# Patient Record
Sex: Female | Born: 1990 | Race: White | Hispanic: No | Smoking: Never smoker
Health system: Southern US, Community
[De-identification: ages and names within clinical notes are randomized; demographics above are authoritative.]

## PROBLEM LIST (undated history)

## (undated) DIAGNOSIS — S92309A Fracture of unspecified metatarsal bone(s), unspecified foot, initial encounter for closed fracture: Secondary | ICD-10-CM

---

## 2017-07-07 HISTORY — PX: TUBAL LIGATION: SHX77

## 2018-02-12 DIAGNOSIS — Z2839 Other underimmunization status: Secondary | ICD-10-CM | POA: Insufficient documentation

## 2018-02-12 DIAGNOSIS — E119 Type 2 diabetes mellitus without complications: Secondary | ICD-10-CM | POA: Insufficient documentation

## 2018-02-15 DIAGNOSIS — O24410 Gestational diabetes mellitus in pregnancy, diet controlled: Secondary | ICD-10-CM | POA: Insufficient documentation

## 2018-02-15 DIAGNOSIS — Z803 Family history of malignant neoplasm of breast: Secondary | ICD-10-CM | POA: Insufficient documentation

## 2018-02-15 DIAGNOSIS — O99013 Anemia complicating pregnancy, third trimester: Secondary | ICD-10-CM | POA: Insufficient documentation

## 2018-02-15 DIAGNOSIS — Z8759 Personal history of other complications of pregnancy, childbirth and the puerperium: Secondary | ICD-10-CM | POA: Insufficient documentation

## 2018-03-02 DIAGNOSIS — Z98891 History of uterine scar from previous surgery: Secondary | ICD-10-CM | POA: Insufficient documentation

## 2020-10-12 DIAGNOSIS — S92211A Displaced fracture of cuboid bone of right foot, initial encounter for closed fracture: Secondary | ICD-10-CM | POA: Diagnosis not present

## 2020-10-12 DIAGNOSIS — S92001B Unspecified fracture of right calcaneus, initial encounter for open fracture: Secondary | ICD-10-CM | POA: Diagnosis not present

## 2020-10-12 DIAGNOSIS — S92309A Fracture of unspecified metatarsal bone(s), unspecified foot, initial encounter for closed fracture: Secondary | ICD-10-CM | POA: Diagnosis not present

## 2020-10-18 ENCOUNTER — Ambulatory Visit: Payer: Medicaid Other | Admitting: Podiatry

## 2020-10-18 ENCOUNTER — Other Ambulatory Visit: Payer: Self-pay

## 2020-10-18 DIAGNOSIS — R600 Localized edema: Secondary | ICD-10-CM

## 2020-10-18 DIAGNOSIS — S92311A Displaced fracture of first metatarsal bone, right foot, initial encounter for closed fracture: Secondary | ICD-10-CM | POA: Diagnosis not present

## 2020-10-18 DIAGNOSIS — S92211A Displaced fracture of cuboid bone of right foot, initial encounter for closed fracture: Secondary | ICD-10-CM | POA: Diagnosis not present

## 2020-10-18 DIAGNOSIS — S92301A Fracture of unspecified metatarsal bone(s), right foot, initial encounter for closed fracture: Secondary | ICD-10-CM | POA: Diagnosis not present

## 2020-10-18 DIAGNOSIS — S92514A Nondisplaced fracture of proximal phalanx of right lesser toe(s), initial encounter for closed fracture: Secondary | ICD-10-CM
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
## 2020-10-24 ENCOUNTER — Telehealth: Payer: Self-pay | Admitting: Urology

## 2020-10-24 NOTE — Telephone Encounter (Signed)
DOS - 10/27/20  TREATMENT OF TARSOMETATARSAL DISLOCATION --- 11657 OPEN TREATMENT METATARSAL --- 90383 - X'S 5 OPEN TX TARSAL FRAC --- 33832  OPEN TX FRACTURE PHALANX --- 91916 - X'S 2  [employer] EFFECTIVE DATE - 01/05/20  PLAN DEDUCTIBLE - $0.00 W/ $0.00 REMAINING OUT OF POCKET -  Member's individual out-of-pocket maximum has no limit COINSURANCE - 0% COPAY - $0.00  RECEIVED FAX FROM [facility] FOR CPT CODES [address], [phone], [phone] AND [phone] HAVE BEEN APPROVED, GOOD FROM 10/27/20 - 01/25/21.

## 2020-10-26 ENCOUNTER — Ambulatory Visit: Payer: Self-pay | Admitting: Podiatry

## 2020-10-26 DIAGNOSIS — S92311A Displaced fracture of first metatarsal bone, right foot, initial encounter for closed fracture: Secondary | ICD-10-CM

## 2020-10-30 ENCOUNTER — Other Ambulatory Visit: Payer: Self-pay

## 2020-10-30 ENCOUNTER — Encounter (HOSPITAL_BASED_OUTPATIENT_CLINIC_OR_DEPARTMENT_OTHER): Payer: Self-pay | Admitting: Podiatry

## 2020-10-30 NOTE — Progress Notes (Addendum)
Spoke w/ via phone for pre-op interview---pt Lab needs dos----  Urine poct             Lab results------none COVID test ------11-01-2020 900 am Arrive at -------530 am 11-02-2020 NPO after MN NO Solid Food.  Clear liquids from MN until---430 am then npo Med rec completed Medications to take morning of surgery -----none Diabetic medication -----n/a Patient instructed to bring photo id and insurance card day of surgery Patient aware to have Driver (ride ) / caregiver  Boyfriend lewis will stay   for 24 hours after surgery  Patient Special Instructions -----none Pre-Op special Istructions -----none Patient verbalized understanding of instructions that were given at this phone interview. Patient denies shortness of breath, chest pain, fever, cough at this phone interview.  H & P dated 10-26-2020 travis phillips pa on chart for 11-02-2020

## 2020-11-01 ENCOUNTER — Encounter: Payer: Medicaid Other | Admitting: Podiatry

## 2020-11-01 ENCOUNTER — Other Ambulatory Visit (HOSPITAL_COMMUNITY)
Admission: RE | Admit: 2020-11-01 | Discharge: 2020-11-01 | Disposition: A | Discharge status: Home / Self Care | Payer: Medicaid Other | Source: Ambulatory Visit | Attending: Podiatry | Admitting: Podiatry

## 2020-11-01 DIAGNOSIS — Z20822 Contact with and (suspected) exposure to covid-19: Secondary | ICD-10-CM | POA: Diagnosis not present

## 2020-11-01 DIAGNOSIS — Z01812 Encounter for preprocedural laboratory examination: Secondary | ICD-10-CM | POA: Diagnosis not present

## 2020-11-01 LAB — SARS CORONAVIRUS 2 (TAT 6-24 HRS): SARS Coronavirus 2: NEGATIVE

## 2020-11-02 ENCOUNTER — Other Ambulatory Visit: Payer: Self-pay

## 2020-11-02 ENCOUNTER — Ambulatory Visit (HOSPITAL_BASED_OUTPATIENT_CLINIC_OR_DEPARTMENT_OTHER)
Admission: RE | Admit: 2020-11-02 | Discharge: 2020-11-02 | Disposition: A | Discharge status: Home / Self Care | Payer: Medicaid Other | Attending: Podiatry | Admitting: Podiatry

## 2020-11-02 ENCOUNTER — Encounter (HOSPITAL_BASED_OUTPATIENT_CLINIC_OR_DEPARTMENT_OTHER)
Admission: RE | Disposition: A | Discharge status: Home / Self Care | Payer: Self-pay | Source: Home / Self Care | Attending: Podiatry

## 2020-11-02 ENCOUNTER — Encounter (HOSPITAL_BASED_OUTPATIENT_CLINIC_OR_DEPARTMENT_OTHER): Payer: Self-pay | Admitting: Podiatry

## 2020-11-02 ENCOUNTER — Ambulatory Visit (HOSPITAL_BASED_OUTPATIENT_CLINIC_OR_DEPARTMENT_OTHER): Payer: Medicaid Other | Admitting: Anesthesiology

## 2020-11-02 DIAGNOSIS — S92311A Displaced fracture of first metatarsal bone, right foot, initial encounter for closed fracture: Secondary | ICD-10-CM | POA: Diagnosis not present

## 2020-11-02 DIAGNOSIS — S92511A Displaced fracture of proximal phalanx of right lesser toe(s), initial encounter for closed fracture: Secondary | ICD-10-CM | POA: Diagnosis not present

## 2020-11-02 DIAGNOSIS — S92211A Displaced fracture of cuboid bone of right foot, initial encounter for closed fracture: Secondary | ICD-10-CM | POA: Insufficient documentation

## 2020-11-02 DIAGNOSIS — Z5309 Procedure and treatment not carried out because of other contraindication: Secondary | ICD-10-CM | POA: Diagnosis not present

## 2020-11-02 DIAGNOSIS — S99101A Unspecified physeal fracture of right metatarsal, initial encounter for closed fracture: Secondary | ICD-10-CM | POA: Insufficient documentation

## 2020-11-02 HISTORY — DX: Fracture of unspecified metatarsal bone(s), unspecified foot, initial encounter for closed fracture: S92.309A

## 2020-11-02 LAB — POCT PREGNANCY, URINE: Preg Test, Ur: NEGATIVE

## 2020-11-02 SURGERY — CANCELLED PROCEDURE
Anesthesia: Regional | Site: Toe | Laterality: Right

## 2020-11-02 MED ORDER — SUCCINYLCHOLINE CHLORIDE 200 MG/10ML IV SOSY
PREFILLED_SYRINGE | INTRAVENOUS | Status: AC
  Filled 2020-11-02: qty 10

## 2020-11-02 MED ORDER — ONDANSETRON HCL 4 MG/2ML IJ SOLN
INTRAMUSCULAR | Status: AC
  Filled 2020-11-02: qty 2

## 2020-11-02 MED ORDER — DEXAMETHASONE SODIUM PHOSPHATE 10 MG/ML IJ SOLN
INTRAMUSCULAR | Status: AC
  Filled 2020-11-02: qty 1

## 2020-11-02 MED ORDER — LIDOCAINE 2% (20 MG/ML) 5 ML SYRINGE
INTRAMUSCULAR | Status: AC
  Filled 2020-11-02: qty 5

## 2020-11-02 MED ORDER — LACTATED RINGERS IV SOLN: INTRAVENOUS | Status: DC

## 2020-11-02 MED ORDER — ROCURONIUM BROMIDE 10 MG/ML (PF) SYRINGE
PREFILLED_SYRINGE | INTRAVENOUS | Status: AC
  Filled 2020-11-02: qty 10

## 2020-11-02 MED ORDER — CEFAZOLIN SODIUM-DEXTROSE 2-4 GM/100ML-% IV SOLN
INTRAVENOUS | Status: AC
  Filled 2020-11-02: qty 100

## 2020-11-02 MED ORDER — PHENYLEPHRINE 40 MCG/ML (10ML) SYRINGE FOR IV PUSH (FOR BLOOD PRESSURE SUPPORT)
PREFILLED_SYRINGE | INTRAVENOUS | Status: AC
  Filled 2020-11-02: qty 10

## 2020-11-02 MED ORDER — FENTANYL CITRATE (PF) 100 MCG/2ML IJ SOLN
INTRAMUSCULAR | Status: AC
  Filled 2020-11-02: qty 2

## 2020-11-02 MED ORDER — FENTANYL CITRATE (PF) 100 MCG/2ML IJ SOLN
100.0000 ug | Freq: Once | INTRAMUSCULAR | Status: AC
  Administered 2020-11-02: 100 ug via INTRAVENOUS

## 2020-11-02 MED ORDER — BUPIVACAINE-EPINEPHRINE (PF) 0.5% -1:200000 IJ SOLN
INTRAMUSCULAR | Status: AC | PRN
  Administered 2020-11-02: 30 mL

## 2020-11-02 MED ORDER — KETOROLAC TROMETHAMINE 30 MG/ML IJ SOLN
INTRAMUSCULAR | Status: AC
  Filled 2020-11-02: qty 1

## 2020-11-02 MED ORDER — EPHEDRINE 5 MG/ML INJ
INTRAVENOUS | Status: AC
  Filled 2020-11-02: qty 10

## 2020-11-02 MED ORDER — GLYCOPYRROLATE PF 0.2 MG/ML IJ SOSY
PREFILLED_SYRINGE | INTRAMUSCULAR | Status: AC
  Filled 2020-11-02: qty 1

## 2020-11-02 MED ORDER — FENTANYL CITRATE (PF) 100 MCG/2ML IJ SOLN
INTRAMUSCULAR | Status: AC
  Filled 2020-11-02: qty 4

## 2020-11-02 MED ORDER — ATROPINE SULFATE 0.4 MG/ML IJ SOLN
INTRAMUSCULAR | Status: AC
  Filled 2020-11-02: qty 1

## 2020-11-02 MED ORDER — CEFAZOLIN SODIUM-DEXTROSE 2-4 GM/100ML-% IV SOLN: 2.0000 g | INTRAVENOUS | Status: DC

## 2020-11-02 MED ORDER — 0.9 % SODIUM CHLORIDE (POUR BTL) OPTIME
TOPICAL | Status: DC | PRN
  Administered 2020-11-02: 1000 mL

## 2020-11-02 MED ORDER — MIDAZOLAM HCL 2 MG/2ML IJ SOLN
INTRAMUSCULAR | Status: AC
  Filled 2020-11-02: qty 2

## 2020-11-02 MED ORDER — MIDAZOLAM HCL 2 MG/2ML IJ SOLN
2.0000 mg | Freq: Once | INTRAMUSCULAR | Status: AC
  Administered 2020-11-02: 2 mg via INTRAVENOUS

## 2020-11-02 MED ORDER — ROPIVACAINE HCL 7.5 MG/ML IJ SOLN
INTRAMUSCULAR | Status: AC | PRN
  Administered 2020-11-02: 15 mL via PERINEURAL

## 2020-11-02 MED ORDER — PROPOFOL 10 MG/ML IV BOLUS
INTRAVENOUS | Status: AC
  Filled 2020-11-02: qty 40

## 2020-11-02 SURGICAL SUPPLY — 50 items
BANDAGE ESMARK 6X9 LF (GAUZE/BANDAGES/DRESSINGS) IMPLANT
BLADE AVERAGE 25X9 (BLADE) IMPLANT
BLADE MINI RND TIP GREEN BEAV (BLADE) IMPLANT
BLADE OSC/SAG .038X5.5 CUT EDG (BLADE) IMPLANT
BLADE SURG 15 STRL LF DISP TIS (BLADE) IMPLANT
BLADE SURG 15 STRL SS (BLADE)
BNDG ELASTIC 4X5.8 VLCR STR LF (GAUZE/BANDAGES/DRESSINGS) IMPLANT
BNDG ESMARK 6X9 LF (GAUZE/BANDAGES/DRESSINGS)
BNDG GAUZE ELAST 4 BULKY (GAUZE/BANDAGES/DRESSINGS) IMPLANT
CHLORAPREP W/TINT 26 (MISCELLANEOUS) IMPLANT
COVER BACK TABLE 60X90IN (DRAPES) IMPLANT
COVER WAND RF STERILE (DRAPES) IMPLANT
CUFF TOURN SGL QUICK 18X4 (TOURNIQUET CUFF) IMPLANT
CUFF TOURN SGL QUICK 24 (TOURNIQUET CUFF)
CUFF TOURN SGL QUICK 34 (TOURNIQUET CUFF)
CUFF TRNQT CYL 24X4X16.5-23 (TOURNIQUET CUFF) IMPLANT
CUFF TRNQT CYL 34X4.125X (TOURNIQUET CUFF) IMPLANT
DECANTER SPIKE VIAL GLASS SM (MISCELLANEOUS) IMPLANT
DRAPE 3/4 80X56 (DRAPES) IMPLANT
DRAPE C-ARM 35X43 STRL (DRAPES) IMPLANT
DRAPE EXTREMITY T 121X128X90 (DISPOSABLE) IMPLANT
DRAPE U-SHAPE 47X51 STRL (DRAPES) IMPLANT
ELECT REM PT RETURN 9FT ADLT (ELECTROSURGICAL)
ELECTRODE REM PT RTRN 9FT ADLT (ELECTROSURGICAL) IMPLANT
GAUZE SPONGE 4X4 12PLY STRL (GAUZE/BANDAGES/DRESSINGS) IMPLANT
GAUZE XEROFORM 1X8 LF (GAUZE/BANDAGES/DRESSINGS) IMPLANT
GLOVE SRG 8 PF TXTR STRL LF DI (GLOVE) IMPLANT
GLOVE SURG ENC MOIS LTX SZ7.5 (GLOVE) IMPLANT
GLOVE SURG UNDER POLY LF SZ8 (GLOVE)
GOWN STRL REUS W/TWL XL LVL3 (GOWN DISPOSABLE) IMPLANT
KIT TURNOVER CYSTO (KITS) IMPLANT
NEEDLE HYPO 25X1 1.5 SAFETY (NEEDLE) IMPLANT
NS IRRIG 1000ML POUR BTL (IV SOLUTION) IMPLANT
PACK BASIN DAY SURGERY FS (CUSTOM PROCEDURE TRAY) IMPLANT
PENCIL SMOKE EVACUATOR (MISCELLANEOUS) IMPLANT
STAPLER VISISTAT 35W (STAPLE) IMPLANT
STOCKINETTE 4 (MISCELLANEOUS) IMPLANT
SUCTION FRAZIER HANDLE 10FR (MISCELLANEOUS)
SUCTION TUBE FRAZIER 10FR DISP (MISCELLANEOUS) IMPLANT
SUT ETHILON 4 0 PS 2 18 (SUTURE) IMPLANT
SUT MNCRL AB 3-0 PS2 18 (SUTURE) IMPLANT
SUT MNCRL AB 4-0 PS2 18 (SUTURE) IMPLANT
SUT VIC AB 2-0 SH 27 (SUTURE)
SUT VIC AB 2-0 SH 27XBRD (SUTURE) IMPLANT
SYR BULB EAR ULCER 3OZ GRN STR (SYRINGE) IMPLANT
SYR CONTROL 10ML LL (SYRINGE) IMPLANT
TRAY DSU PREP LF (CUSTOM PROCEDURE TRAY) IMPLANT
TUBE CONNECTING 12X1/4 (SUCTIONS) IMPLANT
UNDERPAD 30X36 HEAVY ABSORB (UNDERPADS AND DIAPERS) IMPLANT
YANKAUER SUCT BULB TIP NO VENT (SUCTIONS) IMPLANT

## 2020-11-02 NOTE — Progress Notes (Signed)
Orthopedic Tech Progress Note Patient Details:  Nancy Kelley May 26, 1991 [id number] Patient's nurse provided patient with crutches  Ortho Devices Type of Ortho Device: CAM walker,Crutches Ortho Device/Splint Location: Right Foot Ortho Device/Splint Interventions: Application   Post Interventions Patient Tolerated: Well   Nancy Kelley 11/02/2020, 9:29 AM

## 2020-11-02 NOTE — Interval H&P Note (Signed)
History and Physical Interval Note:  11/02/2020 7:12 AM  Nancy Kelley  has presented today for surgery, with the diagnosis of metatarsal fractures of right foot.  The various methods of treatment have been discussed with the patient and family. After consideration of risks, benefits and other options for treatment, the patient has consented to  Procedure(s) with comments: Open reduction internal fixation of right first metatarsal fracture, second third fourth fifth as indicated, cuboid if indicated, pinning of digits/metatarsals 2,3,4,5 as indicated (Right) - General with Popliteal/Saphenous block as a surgical intervention.  The patient's history has been reviewed, patient examined, no change in status, stable for surgery.  I have reviewed the patient's chart and labs.  Questions were answered to the patient's satisfaction.     Park Liter

## 2020-11-02 NOTE — Progress Notes (Signed)
Pt surgery canceled.  Pt aware that office will call her to make another date for surgery.  Dressing applied to the right foot and wrapped prior to boot being placed.  Pt given crutches and discharge instructions for post anesthesia & block care.  Instructions given to boyfriend.  Pt discharged in NAD. Vital signs stable.

## 2020-11-02 NOTE — Progress Notes (Signed)
Assisted Dr. Hollis with right, ultrasound guided, popliteal/saphenous block. Side rails up, monitors on throughout procedure. See vital signs in flow sheet. Tolerated Procedure well. 

## 2020-11-02 NOTE — Discharge Instructions (Signed)
Regional Anesthesia Blocks  1. Numbness or the inability to move the "blocked" extremity may last from 3-48 hours after placement. The length of time depends on the medication injected and your individual response to the medication. If the numbness is not going away after 48 hours, call your surgeon.  2. The extremity that is blocked will need to be protected until the numbness is gone and the  Strength has returned. Because you cannot feel it, you will need to take extra care to avoid injury. Because it may be weak, you may have difficulty moving it or using it. You may not know what position it is in without looking at it while the block is in effect.  3. For blocks in the legs and feet, returning to weight bearing and walking needs to be done carefully. You will need to wait until the numbness is entirely gone and the strength has returned. You should be able to move your leg and foot normally before you try and bear weight or walk. You will need someone to be with you when you first try to ensure you do not fall and possibly risk injury.  4. Bruising and tenderness at the needle site are common side effects and will resolve in a few days.  5. Persistent numbness or new problems with movement should be communicated to the surgeon or the [facility] [phone] [facility] [phone]).General Anesthesia, Adult, Care After This sheet gives you information about how to care for yourself after your procedure. Your health care provider may also give you more specific instructions. If you have problems or questions, contact your health care provider. What can I expect after the procedure? After the procedure, the following side effects are common:  Pain or discomfort at the IV site.  Nausea.  Vomiting.  Sore throat.  Trouble concentrating.  Feeling cold or chills.  Feeling weak or tired.  Sleepiness and fatigue.  Soreness and body aches. These side effects  can affect parts of the body that were not involved in surgery. Follow these instructions at home: For the time period you were told by your health care provider:  Rest.  Do not participate in activities where you could fall or become injured.  Do not drive or use machinery.  Do not drink alcohol.  Do not take sleeping pills or medicines that cause drowsiness.  Do not make important decisions or sign legal documents.  Do not take care of children on your own.   Eating and drinking  Follow any instructions from your health care provider about eating or drinking restrictions.  When you feel hungry, start by eating small amounts of foods that are soft and easy to digest (bland), such as toast. Gradually return to your regular diet.  Drink enough fluid to keep your urine pale yellow.  If you vomit, rehydrate by drinking water, juice, or clear broth. General instructions  If you have sleep apnea, surgery and certain medicines can increase your risk for breathing problems. Follow instructions from your health care provider about wearing your sleep device: ? Anytime you are sleeping, including during daytime naps. ? While taking prescription pain medicines, sleeping medicines, or medicines that make you drowsy.  Have a responsible adult stay with you for the time you are told. It is important to have someone help care for you until you are awake and alert.  Return to your normal activities as told by your health care provider. Ask your health care  provider what activities are safe for you.  Take over-the-counter and prescription medicines only as told by your health care provider.  If you smoke, do not smoke without supervision.  Keep all follow-up visits as told by your health care provider. This is important. Contact a health care provider if:  You have nausea or vomiting that does not get better with medicine.  You cannot eat or drink without vomiting.  You have pain that  does not get better with medicine.  You are unable to pass urine.  You develop a skin rash.  You have a fever.  You have redness around your IV site that gets worse. Get help right away if:  You have difficulty breathing.  You have chest pain.  You have blood in your urine or stool, or you vomit blood. Summary  After the procedure, it is common to have a sore throat or nausea. It is also common to feel tired.  Have a responsible adult stay with you for the time you are told. It is important to have someone help care for you until you are awake and alert.  When you feel hungry, start by eating small amounts of foods that are soft and easy to digest (bland), such as toast. Gradually return to your regular diet.  Drink enough fluid to keep your urine pale yellow.  Return to your normal activities as told by your health care provider. Ask your health care provider what activities are safe for you. This information is not intended to replace advice given to you by your health care provider. Make sure you discuss any questions you have with your health care provider. Document Revised: 03/08/2020 Document Reviewed: 10/06/2019 Elsevier Patient Education  2021 Elsevier Inc[employer]   After Surgery Instructions   1) If you are recuperating from surgery anywhere other than home, please be sure to leave [place] the number where you can be reached.  2) Go directly home and rest.  3) Keep the operated foot(feet) elevated six inches above the hip when sitting or lying down. This will help control swelling and pain.  4) Support the elevated foot and leg with pillows. DO NOT PLACE PILLOWS UNDER THE KNEE.  5) DO NOT REMOVE or get your bandages WET, unless you were given different instructions by your doctor to do so. This increases the risk of infection.  6) Wear your surgical shoe or surgical boot at all times when you are up on your feet.  7) A limited amount of pain and swelling may occur. The  skin may take on a bruised appearance. DO NOT BE ALARMED, THIS IS NORMAL.  8) For slight pain and swelling, apply an ice pack directly over the bandages for 15 minutes only out of each hour of the day. Continue until seen in the office for your first post op visit. DO NOT APPLY ANY FORM OF HEAT TO THE AREA.  9) Have prescriptions filled immediately and take as directed.  10) Drink lots of liquids, water and juice to stay hydrated.  11) CALL IMMEDIATELY IF:  *Bleeding continues until the following day of surgery  *Pain increases and/or does not respond to medication  *Bandages or cast appears to tight  *If your bandage gets wet  *Trip, fall or stump your surgical foot  *If your temperature goes above 101  *If you have ANY questions at all  12) You are expected to be non-weightbearing after your surgery.   If you need to reach the nurse  for any reason, please call: [place]/: [phone] [place]: [phone] : [phone]

## 2020-11-02 NOTE — Progress Notes (Signed)
Required hardware not available for the case today.   I discussed with the patient for her safety it would be best to reschedule the case. Patient verbalized understanding.  Park Liter, DPM

## 2020-11-02 NOTE — Anesthesia Preprocedure Evaluation (Addendum)
Anesthesia Evaluation  Patient identified by MRN, date of birth, ID band Patient awake    Reviewed: Allergy & Precautions, NPO status , Patient's Chart, lab work & pertinent test results  Airway Mallampati: II  TM Distance: >3 FB Neck ROM: Full    Dental  (+) Teeth Intact, Dental Advisory Given   Pulmonary neg pulmonary ROS,    breath sounds clear to auscultation       Cardiovascular negative cardio ROS   Rhythm:Regular Rate:Normal     Neuro/Psych negative neurological ROS  negative psych ROS   GI/Hepatic negative GI ROS, Neg liver ROS,   Endo/Other  negative endocrine ROS  Renal/GU negative Renal ROS     Musculoskeletal negative musculoskeletal ROS (+)   Abdominal (+) + obese,   Peds  Hematology negative hematology ROS (+)   Anesthesia Other Findings   Reproductive/Obstetrics                             Anesthesia Physical Anesthesia Plan  ASA: II  Anesthesia Plan: Regional   Post-op Pain Management:    Induction: Intravenous  PONV Risk Score and Plan: 3 and Ondansetron, Propofol infusion and Midazolam  Airway Management Planned: Natural Airway and Simple Face Mask  Additional Equipment: None  Intra-op Plan:   Post-operative Plan: Extubation in OR  Informed Consent: I have reviewed the patients History and Physical, chart, labs and discussed the procedure including the risks, benefits and alternatives for the proposed anesthesia with the patient or authorized representative who has indicated his/her understanding and acceptance.       Plan Discussed with: CRNA  Anesthesia Plan Comments: (Case cancelled due to inability to locate proper instruments and/or fixation devices. Nancy Kelley has already received a peripheral nerve block. She has been education on duration and risk of the block. She was instructed to protect/guard her foot for the next 24-36 hours due to the  nerve block and lack of sensation. Nancy Kelley expresses understanding. )       Anesthesia Quick Evaluation

## 2020-11-05 NOTE — Progress Notes (Addendum)
Spoke w/ via phone for pre-op interview---pt Lab needs dos----  Urine poct  (per anesthesia), surgery  orders pending           Lab results------none COVID test ------rapid covid dos arrive 900 am 11-07-2020 Arrive at ------- 900 am 11-07-2020 NPO after MN NO Solid Food.  Clear liquids from MN until---1000 am am then npo Med rec completed Medications to take morning of surgery -----none Diabetic medication -----n/a Patient instructed to bring photo id and insurance card day of surgery Patient aware to have Driver (ride ) / caregiver  Boyfriend lewis will stay   for 24 hours after surgery  Patient Special Instructions -----none Pre-Op special Istructions -----surgery orders requested dr price epic ib Patient verbalized understanding of instructions that were given at this phone interview. Patient denies shortness of breath, chest pain, fever, cough at this phone interview.  H & P dated 10-26-2020 travis phillips pa on chart for 11-07-2020 surgery  Pt will be rapid covid dos arrive 900 am beverly taavon aware Pt will be in black chevy truck and will call desk

## 2020-11-07 ENCOUNTER — Ambulatory Visit (HOSPITAL_BASED_OUTPATIENT_CLINIC_OR_DEPARTMENT_OTHER): Payer: Medicaid Other | Admitting: Anesthesiology

## 2020-11-07 ENCOUNTER — Encounter (HOSPITAL_BASED_OUTPATIENT_CLINIC_OR_DEPARTMENT_OTHER)
Admission: RE | Disposition: A | Discharge status: Home / Self Care | Payer: Self-pay | Source: Home / Self Care | Attending: Podiatry

## 2020-11-07 ENCOUNTER — Encounter (HOSPITAL_BASED_OUTPATIENT_CLINIC_OR_DEPARTMENT_OTHER): Payer: Self-pay | Admitting: Podiatry

## 2020-11-07 ENCOUNTER — Encounter: Payer: Self-pay | Admitting: Podiatry

## 2020-11-07 ENCOUNTER — Ambulatory Visit (HOSPITAL_COMMUNITY): Payer: Medicaid Other

## 2020-11-07 ENCOUNTER — Ambulatory Visit (HOSPITAL_BASED_OUTPATIENT_CLINIC_OR_DEPARTMENT_OTHER)
Admission: RE | Admit: 2020-11-07 | Discharge: 2020-11-07 | Disposition: A | Discharge status: Home / Self Care | Payer: Medicaid Other | Attending: Podiatry | Admitting: Podiatry

## 2020-11-07 DIAGNOSIS — Z20822 Contact with and (suspected) exposure to covid-19: Secondary | ICD-10-CM | POA: Diagnosis not present

## 2020-11-07 DIAGNOSIS — Z9889 Other specified postprocedural states: Secondary | ICD-10-CM

## 2020-11-07 DIAGNOSIS — S92311A Displaced fracture of first metatarsal bone, right foot, initial encounter for closed fracture: Secondary | ICD-10-CM | POA: Insufficient documentation

## 2020-11-07 DIAGNOSIS — S92301D Fracture of unspecified metatarsal bone(s), right foot, subsequent encounter for fracture with routine healing: Secondary | ICD-10-CM | POA: Diagnosis not present

## 2020-11-07 HISTORY — PX: ORIF TOE FRACTURE: SHX5032

## 2020-11-07 LAB — RESP PANEL BY RT-PCR (FLU A&B, COVID) ARPGX2
Influenza A by PCR: NEGATIVE
Influenza B by PCR: NEGATIVE
SARS Coronavirus 2 by RT PCR: NEGATIVE

## 2020-11-07 LAB — POCT PREGNANCY, URINE: Preg Test, Ur: NEGATIVE

## 2020-11-07 SURGERY — OPEN REDUCTION INTERNAL FIXATION (ORIF) METATARSAL (TOE) FRACTURE
Anesthesia: Regional | Site: Toe | Laterality: Right

## 2020-11-07 MED ORDER — PROPOFOL 10 MG/ML IV BOLUS
INTRAVENOUS | Status: AC
  Filled 2020-11-07: qty 20

## 2020-11-07 MED ORDER — MIDAZOLAM HCL 2 MG/2ML IJ SOLN
2.0000 mg | Freq: Once | INTRAMUSCULAR | Status: AC
  Administered 2020-11-07: 2 mg via INTRAVENOUS

## 2020-11-07 MED ORDER — CEPHALEXIN 500 MG PO CAPS: 500.0000 mg | ORAL_CAPSULE | Freq: Two times a day (BID) | ORAL | 0 refills | Status: DC

## 2020-11-07 MED ORDER — MIDAZOLAM HCL 5 MG/5ML IJ SOLN
INTRAMUSCULAR | Status: DC | PRN
  Administered 2020-11-07: 2 mg via INTRAVENOUS

## 2020-11-07 MED ORDER — PROPOFOL 10 MG/ML IV BOLUS
INTRAVENOUS | Status: DC | PRN
  Administered 2020-11-07: 200 mg via INTRAVENOUS

## 2020-11-07 MED ORDER — BUPIVACAINE-EPINEPHRINE (PF) 0.5% -1:200000 IJ SOLN
INTRAMUSCULAR | Status: DC | PRN
  Administered 2020-11-07: 30 mL via PERINEURAL

## 2020-11-07 MED ORDER — ACETAMINOPHEN 10 MG/ML IV SOLN: 1000.0000 mg | Freq: Once | INTRAVENOUS | Status: DC | PRN

## 2020-11-07 MED ORDER — ROPIVACAINE HCL 7.5 MG/ML IJ SOLN
INTRAMUSCULAR | Status: DC | PRN
  Administered 2020-11-07: 15 mL via PERINEURAL

## 2020-11-07 MED ORDER — AMISULPRIDE (ANTIEMETIC) 5 MG/2ML IV SOLN
INTRAVENOUS | Status: AC
  Filled 2020-11-07: qty 4

## 2020-11-07 MED ORDER — RIVAROXABAN 10 MG PO TABS: 10.0000 mg | ORAL_TABLET | Freq: Every day | ORAL | 0 refills | Status: DC

## 2020-11-07 MED ORDER — ACETAMINOPHEN 160 MG/5ML PO SOLN: 325.0000 mg | ORAL | Status: DC | PRN

## 2020-11-07 MED ORDER — LIDOCAINE 2% (20 MG/ML) 5 ML SYRINGE
INTRAMUSCULAR | Status: AC
  Filled 2020-11-07: qty 5

## 2020-11-07 MED ORDER — ONDANSETRON HCL 4 MG/2ML IJ SOLN
INTRAMUSCULAR | Status: AC
  Filled 2020-11-07: qty 2

## 2020-11-07 MED ORDER — FENTANYL CITRATE (PF) 100 MCG/2ML IJ SOLN
INTRAMUSCULAR | Status: AC
  Filled 2020-11-07: qty 2

## 2020-11-07 MED ORDER — 0.9 % SODIUM CHLORIDE (POUR BTL) OPTIME
TOPICAL | Status: DC | PRN
  Administered 2020-11-07: 500 mL

## 2020-11-07 MED ORDER — DEXAMETHASONE SODIUM PHOSPHATE 4 MG/ML IJ SOLN
INTRAMUSCULAR | Status: DC | PRN
  Administered 2020-11-07: 10 mg via INTRAVENOUS

## 2020-11-07 MED ORDER — MIDAZOLAM HCL 2 MG/2ML IJ SOLN
INTRAMUSCULAR | Status: AC
  Filled 2020-11-07: qty 2

## 2020-11-07 MED ORDER — ONDANSETRON HCL 4 MG/2ML IJ SOLN
INTRAMUSCULAR | Status: DC | PRN
  Administered 2020-11-07: 4 mg via INTRAVENOUS

## 2020-11-07 MED ORDER — FENTANYL CITRATE (PF) 100 MCG/2ML IJ SOLN
50.0000 ug | Freq: Once | INTRAMUSCULAR | Status: AC
  Administered 2020-11-07: 50 ug via INTRAVENOUS

## 2020-11-07 MED ORDER — FENTANYL CITRATE (PF) 100 MCG/2ML IJ SOLN: 25.0000 ug | INTRAMUSCULAR | Status: DC | PRN

## 2020-11-07 MED ORDER — LACTATED RINGERS IV SOLN: INTRAVENOUS | Status: DC

## 2020-11-07 MED ORDER — PROMETHAZINE HCL 25 MG/ML IJ SOLN: 6.2500 mg | INTRAMUSCULAR | Status: DC | PRN

## 2020-11-07 MED ORDER — FENTANYL CITRATE (PF) 100 MCG/2ML IJ SOLN
INTRAMUSCULAR | Status: DC | PRN
  Administered 2020-11-07 (×6): 50 ug via INTRAVENOUS

## 2020-11-07 MED ORDER — CEFAZOLIN SODIUM-DEXTROSE 2-4 GM/100ML-% IV SOLN
2.0000 g | INTRAVENOUS | Status: AC
  Administered 2020-11-07: 2 g via INTRAVENOUS

## 2020-11-07 MED ORDER — CEFAZOLIN SODIUM-DEXTROSE 2-4 GM/100ML-% IV SOLN
INTRAVENOUS | Status: AC
  Filled 2020-11-07: qty 100

## 2020-11-07 MED ORDER — AMISULPRIDE (ANTIEMETIC) 5 MG/2ML IV SOLN
10.0000 mg | Freq: Once | INTRAVENOUS | Status: AC | PRN
  Administered 2020-11-07: 10 mg via INTRAVENOUS

## 2020-11-07 MED ORDER — OXYCODONE HCL 5 MG PO TABS: 5.0000 mg | ORAL_TABLET | Freq: Once | ORAL | Status: DC | PRN

## 2020-11-07 MED ORDER — OXYCODONE HCL 5 MG/5ML PO SOLN: 5.0000 mg | Freq: Once | ORAL | Status: DC | PRN

## 2020-11-07 MED ORDER — LIDOCAINE HCL (CARDIAC) PF 100 MG/5ML IV SOSY
PREFILLED_SYRINGE | INTRAVENOUS | Status: DC | PRN
  Administered 2020-11-07: 60 mg via INTRAVENOUS

## 2020-11-07 MED ORDER — OXYCODONE-ACETAMINOPHEN 5-325 MG PO TABS: 1.0000 | ORAL_TABLET | ORAL | 0 refills | Status: DC | PRN

## 2020-11-07 MED ORDER — ACETAMINOPHEN 325 MG PO TABS
325.0000 mg | ORAL_TABLET | ORAL | Status: DC | PRN
Start: 2020-11-07 — End: 2020-11-07

## 2020-11-07 MED ORDER — DEXAMETHASONE SODIUM PHOSPHATE 10 MG/ML IJ SOLN
INTRAMUSCULAR | Status: AC
  Filled 2020-11-07: qty 1

## 2020-11-07 SURGICAL SUPPLY — 71 items
1.2 k wire ×2 IMPLANT
BANDAGE ESMARK 6X9 LF (GAUZE/BANDAGES/DRESSINGS) ×2 IMPLANT
BIT DRILL 2.5X3.5XSCR (BIT) ×1 IMPLANT
BIT DRL 2.5X3.5XSCR (BIT) ×1
BLADE AVERAGE 25X9 (BLADE) IMPLANT
BLADE MINI RND TIP GREEN BEAV (BLADE) ×2 IMPLANT
BLADE OSC/SAG .038X5.5 CUT EDG (BLADE) IMPLANT
BLADE SURG 15 STRL LF DISP TIS (BLADE) ×2 IMPLANT
BLADE SURG 15 STRL SS (BLADE) ×2
BNDG ELASTIC 4X5.8 VLCR STR LF (GAUZE/BANDAGES/DRESSINGS) ×2 IMPLANT
BNDG ESMARK 6X9 LF (GAUZE/BANDAGES/DRESSINGS) ×4
BNDG GAUZE ELAST 4 BULKY (GAUZE/BANDAGES/DRESSINGS) ×2 IMPLANT
CHLORAPREP W/TINT 26 (MISCELLANEOUS) ×2 IMPLANT
COVER BACK TABLE 60X90IN (DRAPES) ×2 IMPLANT
COVER WAND RF STERILE (DRAPES) ×2 IMPLANT
CUFF TOURN SGL QUICK 18X4 (TOURNIQUET CUFF) IMPLANT
CUFF TOURN SGL QUICK 24 (TOURNIQUET CUFF)
CUFF TOURN SGL QUICK 34 (TOURNIQUET CUFF) ×1
CUFF TRNQT CYL 24X4X16.5-23 (TOURNIQUET CUFF) IMPLANT
CUFF TRNQT CYL 34X4.125X (TOURNIQUET CUFF) ×1 IMPLANT
DECANTER SPIKE VIAL GLASS SM (MISCELLANEOUS) IMPLANT
DRAPE 3/4 80X56 (DRAPES) ×2 IMPLANT
DRAPE C-ARM 35X43 STRL (DRAPES) ×2 IMPLANT
DRAPE EXTREMITY T 121X128X90 (DISPOSABLE) ×2 IMPLANT
DRAPE INCISE IOBAN 66X45 STRL (DRAPES) ×2 IMPLANT
DRAPE OEC MINIVIEW 54X84 (DRAPES) ×2 IMPLANT
DRAPE U-SHAPE 47X51 STRL (DRAPES) ×2 IMPLANT
DRILL BIT 2.5MM (BIT) ×1
ELECT REM PT RETURN 9FT ADLT (ELECTROSURGICAL) ×2
ELECTRODE REM PT RTRN 9FT ADLT (ELECTROSURGICAL) ×1 IMPLANT
GAUZE SPONGE 4X4 12PLY STRL (GAUZE/BANDAGES/DRESSINGS) ×2 IMPLANT
GAUZE SPONGE 4X4 12PLY STRL LF (GAUZE/BANDAGES/DRESSINGS) ×2 IMPLANT
GAUZE XEROFORM 1X8 LF (GAUZE/BANDAGES/DRESSINGS) ×2 IMPLANT
GLOVE SRG 8 PF TXTR STRL LF DI (GLOVE) ×1 IMPLANT
GLOVE SURG ENC MOIS LTX SZ7.5 (GLOVE) ×2 IMPLANT
GLOVE SURG UNDER POLY LF SZ8 (GLOVE) ×1
GOWN STRL REUS W/TWL XL LVL3 (GOWN DISPOSABLE) ×2 IMPLANT
K-WIRE 1.2X100 (WIRE) ×2
K-WIRE OLIVE 1.2X65 (WIRE) ×4
KIT TURNOVER CYSTO (KITS) ×2 IMPLANT
KWIRE 1.2X100 (WIRE) ×1 IMPLANT
KWIRE OLIVE 1.2X65 (WIRE) ×2 IMPLANT
NEEDLE HYPO 25X1 1.5 SAFETY (NEEDLE) IMPLANT
NS IRRIG 1000ML POUR BTL (IV SOLUTION) ×2 IMPLANT
PACK BASIN DAY SURGERY FS (CUSTOM PROCEDURE TRAY) ×2 IMPLANT
PENCIL SMOKE EVACUATOR (MISCELLANEOUS) ×2 IMPLANT
PLATE LAPIDUS CP RT (Plate) ×2 IMPLANT
SCREW BONE CANN 3X38 NS (Screw) ×2 IMPLANT
SCREW LOCK 3.5X20MM (Screw) ×2 IMPLANT
SCREW LOCK T8 16X3.5X STRDR (Screw) ×2 IMPLANT
SCREW LOCK T8 20X3.5X STRDR (Screw) ×1 IMPLANT
SCREW LOCKING 3.5X16MM (Screw) ×2 IMPLANT
SCREW LOCKING 3.5X20MM (Screw) ×1 IMPLANT
SCREW LOCKING 3.5X22MAX (Screw) ×2 IMPLANT
SCREW NON LOCK 3.5X16MM (Screw) ×2 IMPLANT
SCREW NON LOCKING 3.5X22MM (Screw) ×2 IMPLANT
STAPLER VISISTAT 35W (STAPLE) ×2 IMPLANT
SUCTION FRAZIER HANDLE 10FR (MISCELLANEOUS) ×1
SUCTION TUBE FRAZIER 10FR DISP (MISCELLANEOUS) ×1 IMPLANT
SUT ETHILON 4 0 PS 2 18 (SUTURE) IMPLANT
SUT MERSILENE 3 0 FS 1 (SUTURE) ×2 IMPLANT
SUT MNCRL AB 3-0 PS2 18 (SUTURE) IMPLANT
SUT MNCRL AB 4-0 PS2 18 (SUTURE) ×2 IMPLANT
SUT VIC AB 2-0 SH 27 (SUTURE)
SUT VIC AB 2-0 SH 27XBRD (SUTURE) IMPLANT
SYR BULB EAR ULCER 3OZ GRN STR (SYRINGE) ×4 IMPLANT
SYR CONTROL 10ML LL (SYRINGE) IMPLANT
TRAY DSU PREP LF (CUSTOM PROCEDURE TRAY) ×2 IMPLANT
TUBE CONNECTING 12X1/4 (SUCTIONS) ×2 IMPLANT
UNDERPAD 30X36 HEAVY ABSORB (UNDERPADS AND DIAPERS) ×2 IMPLANT
YANKAUER SUCT BULB TIP NO VENT (SUCTIONS) IMPLANT

## 2020-11-07 NOTE — Anesthesia Procedure Notes (Signed)
Anesthesia Regional Block: Adductor canal block   Pre-Anesthetic Checklist: ,, timeout performed, Correct Patient, Correct Site, Correct Laterality, Correct Procedure, Correct Position, site marked, Risks and benefits discussed,  Surgical consent,  Pre-op evaluation,  At surgeon's request and post-op pain management  Laterality: Right  Prep: chloraprep       Needles:  Injection technique: Single-shot  Needle Type: Echogenic Stimulator Needle     Needle Length: 9cm  Needle Gauge: 21     Additional Needles:   Procedures:,,,, ultrasound used (permanent image in chart),,,,  Narrative:  Start time: 11/07/2020 1:02 PM End time: 11/07/2020 1:05 PM Injection made incrementally with aspirations every 5 mL.  Performed by: Personally  Anesthesiologist: Shelton Silvas, MD  Additional Notes: Patient tolerated the procedure well. Local anesthetic introduced in an incremental fashion under minimal resistance after negative aspirations. No paresthesias were elicited. After completion of the procedure, no acute issues were identified and patient continued to be monitored by RN.

## 2020-11-07 NOTE — Discharge Instructions (Signed)
Regional Anesthesia Blocks  1. Numbness or the inability to move the "blocked" extremity may last from 3-48 hours after placement. The length of time depends on the medication injected and your individual response to the medication. If the numbness is not going away after 48 hours, call your surgeon.  2. The extremity that is blocked will need to be protected until the numbness is gone and the  Strength has returned. Because you cannot feel it, you will need to take extra care to avoid injury. Because it may be weak, you may have difficulty moving it or using it. You may not know what position it is in without looking at it while the block is in effect.  3. For blocks in the legs and feet, returning to weight bearing and walking needs to be done carefully. You will need to wait until the numbness is entirely gone and the strength has returned. You should be able to move your leg and foot normally before you try and bear weight or walk. You will need someone to be with you when you first try to ensure you do not fall and possibly risk injury.  4. Bruising and tenderness at the needle site are common side effects and will resolve in a few days.  5. Persistent numbness or new problems with movement should be communicated to the surgeon or the [facility] [phone] [facility] [phone]).  After Surgery Instructions   1) If you are recuperating from surgery anywhere other than home, please be sure to leave [place] the number where you can be reached.  2) Go directly home and rest.  3) Keep the operated foot(feet) elevated six inches above the hip when sitting or lying down. This will help control swelling and pain.  4) Support the elevated foot and leg with pillows. DO NOT PLACE PILLOWS UNDER THE KNEE.  5) DO NOT REMOVE or get your bandages WET, unless you were given different instructions by your doctor to do so. This increases the risk of infection.  6) Wear your  surgical shoe or surgical boot at all times when you are up on your feet.  7) A limited amount of pain and swelling may occur. The skin may take on a bruised appearance. DO NOT BE ALARMED, THIS IS NORMAL.  8) For slight pain and swelling, apply an ice pack directly over the bandages for 15 minutes only out of each hour of the day. Continue until seen in the office for your first post op visit. DO NOT APPLY ANY FORM OF HEAT TO THE AREA.  9) Have prescriptions filled immediately and take as directed.  10) Drink lots of liquids, water and juice to stay hydrated.  11) CALL IMMEDIATELY IF:  *Bleeding continues until the following day of surgery  *Pain increases and/or does not respond to medication  *Bandages or cast appears to tight  *If your bandage gets wet  *Trip, fall or stump your surgical foot  *If your temperature goes above 101  *If you have ANY questions at all  12) You are expected to be non-weightbearing after your surgery.   If you need to reach the nurse for any reason, please call: [place]: [phone] [place]: [phone] [place]: [phone]  Post Anesthesia Home Care Instructions  Activity: Get plenty of rest for the remainder of the day. A responsible adult should stay with you for 24 hours following the procedure.  For the next 24 hours, DO NOT: -Drive  a car -Advertising copywriter -Drink alcoholic beverages -Take any medication unless instructed by your physician -Make any legal decisions or sign important papers.  Meals: Start with liquid foods such as gelatin or soup. Progress to regular foods as tolerated. Avoid greasy, spicy, heavy foods. If nausea and/or vomiting occur, drink only clear liquids until the nausea and/or vomiting subsides. Call your physician if vomiting continues.  Special Instructions/Symptoms: Your throat may feel dry or sore from the anesthesia or the breathing tube placed in your throat during surgery. If this  causes discomfort, gargle with warm salt water. The discomfort should disappear within 24 hours.  If you had a scopolamine patch placed behind your ear for the management of post- operative nausea and/or vomiting:  1. The medication in the patch is effective for 72 hours, after which it should be removed.  Wrap patch in a tissue and discard in the trash. Wash hands thoroughly with soap and water. 2. You may remove the patch earlier than 72 hours if you experience unpleasant side effects which may include dry mouth, dizziness or visual disturbances. 3. Avoid touching the patch. Wash your hands with soap and water after contact with the patch.

## 2020-11-07 NOTE — Anesthesia Procedure Notes (Signed)
Procedure Name: LMA Insertion Date/Time: 11/07/2020 1:24 PM Performed by: Jessica Priest, CRNA Pre-anesthesia Checklist: Patient identified, Emergency Drugs available, Suction available, Patient being monitored and Timeout performed Patient Re-evaluated:Patient Re-evaluated prior to induction Oxygen Delivery Method: Circle system utilized Preoxygenation: Pre-oxygenation with 100% oxygen Induction Type: IV induction Ventilation: Mask ventilation without difficulty LMA: LMA inserted LMA Size: 4.0 Number of attempts: 1 Airway Equipment and Method: Bite block Placement Confirmation: positive ETCO2,  breath sounds checked- equal and bilateral and CO2 detector Tube secured with: Tape Dental Injury: Teeth and Oropharynx as per pre-operative assessment

## 2020-11-07 NOTE — Interval H&P Note (Signed)
History and Physical Interval Note:  11/07/2020 12:40 PM  [place]  has presented today for surgery, with the diagnosis of Metatarsal fractures right foot.  The various methods of treatment have been discussed with the patient and family. After consideration of risks, benefits and other options for treatment, the patient has consented to  Procedure(s) with comments: Open reduction internal fixation of right first metatarsal fracture, second third fourth fifth as indicated, cuboid if indicated, pinning of digits/metatarsals 2,3,4,5 as indicated (Right) - General with Popliteal/Saphenous block as a surgical intervention.  The patient's history has been reviewed, patient examined, no change in status, stable for surgery.  I have reviewed the patient's chart and labs.  Questions were answered to the patient's satisfaction.     [place] Liter

## 2020-11-07 NOTE — Anesthesia Preprocedure Evaluation (Addendum)
Anesthesia Evaluation  Patient identified by MRN, date of birth, ID band Patient awake    Reviewed: Allergy & Precautions, NPO status , Patient's Chart, lab work & pertinent test results  Airway Mallampati: II  TM Distance: >3 FB Neck ROM: Full    Dental  (+) Teeth Intact, Dental Advisory Given   Pulmonary neg pulmonary ROS,    breath sounds clear to auscultation       Cardiovascular negative cardio ROS   Rhythm:Regular Rate:Normal     Neuro/Psych negative neurological ROS  negative psych ROS   GI/Hepatic negative GI ROS, Neg liver ROS,   Endo/Other  negative endocrine ROS  Renal/GU negative Renal ROS     Musculoskeletal negative musculoskeletal ROS (+)   Abdominal (+) + obese,   Peds  Hematology negative hematology ROS (+)   Anesthesia Other Findings   Reproductive/Obstetrics                             Anesthesia Physical  Anesthesia Plan  ASA: II  Anesthesia Plan: General   Post-op Pain Management: GA combined w/ Regional for post-op pain   Induction: Intravenous  PONV Risk Score and Plan: 3 and Ondansetron, Midazolam, Dexamethasone and Scopolamine patch - Pre-op  Airway Management Planned: LMA  Additional Equipment: None  Intra-op Plan:   Post-operative Plan: Extubation in OR  Informed Consent: I have reviewed the patients History and Physical, chart, labs and discussed the procedure including the risks, benefits and alternatives for the proposed anesthesia with the patient or authorized representative who has indicated his/her understanding and acceptance.       Plan Discussed with: CRNA  Anesthesia Plan Comments:        Anesthesia Quick Evaluation
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
## 2020-11-07 NOTE — Brief Op Note (Signed)
11/07/2020  3:20 PM  PATIENT:  [place]  30 y.o. female  PRE-OPERATIVE DIAGNOSIS:  Metatarsal fractures right foot  POST-OPERATIVE DIAGNOSIS:  Metatarsal fractures right foot  PROCEDURE:  Procedure(s) with comments: Open reduction internal fixation of right first metatarsal fracture (Right) - General with Popliteal/Saphenous block  SURGEON:  Surgeon(s) and Role:    * Evelina Bucy, DPM - Primary  PHYSICIAN ASSISTANT:   ASSISTANTS: none   ANESTHESIA:   local and MAC  EBL:  10 mL   BLOOD ADMINISTERED:none  DRAINS: none   LOCAL MEDICATIONS USED:  NONE  SPECIMEN:  No Specimen  DISPOSITION OF SPECIMEN:  N/A  COUNTS:  YES  TOURNIQUET:   Total Tourniquet Time Documented: Thigh (Right) - 89 minutes Total: Thigh (Right) - 89 minutes   DICTATION: .Viviann Spare Dictation  PLAN OF CARE: Discharge to home after PACU  PATIENT DISPOSITION:  PACU - hemodynamically stable.   Delay start of Pharmacological VTE agent (>24hrs) due to surgical blood loss or risk of bleeding: not applicable

## 2020-11-07 NOTE — Progress Notes (Signed)
Assisted Dr. Hart [place] with right, ultrasound guided, popliteal, adductor canal blocks. Side rails up, monitors on throughout procedure. See vital signs in flow sheet. Tolerated Procedure well.

## 2020-11-07 NOTE — Anesthesia Postprocedure Evaluation (Signed)
Anesthesia Post Note  Patient: [name]  Procedure(s) Performed: Open reduction internal fixation of right first metatarsal fracture (Right Toe)     Patient location during evaluation: PACU Anesthesia Type: Regional and General Level of consciousness: awake and alert Pain management: pain level controlled Vital Signs Assessment: post-procedure vital signs reviewed and stable Respiratory status: spontaneous breathing, nonlabored ventilation, respiratory function stable and patient connected to nasal cannula oxygen Cardiovascular status: blood pressure returned to baseline and stable Postop Assessment: no apparent nausea or vomiting Anesthetic complications: no   No complications documented.  Last Vitals:  Vitals:   11/07/20 1600 11/07/20 1615  BP: 123/78 118/72  Pulse: 93 99  Resp: 20 (!) 29  Temp:    SpO2: 95% 97%    Last Pain:  Vitals:   11/07/20 1615  TempSrc:   PainSc: 0-No pain                 Yamilee Harmes L Vanesha [place]

## 2020-11-07 NOTE — Transfer of Care (Signed)
Immediate Anesthesia Transfer of Care Note  Patient: Nancy Kelley  Procedure(s) Performed: Procedure(s) (LRB): Open reduction internal fixation of right first metatarsal fracture (Right)  Patient Location: PACU  Anesthesia Type: General  Level of Consciousness: awake, sedated, patient cooperative and responds to stimulation  Airway & Oxygen Therapy: Patient Spontanous Breathing w/ [place] 02 and soft FM   Post-op Assessment: Report given to PACU RN, Post -op Vital signs reviewed and stable and Patient moving all extremities  Post vital signs: Reviewed and stable  Complications: No apparent anesthesia complications

## 2020-11-07 NOTE — Anesthesia Procedure Notes (Signed)
Anesthesia Regional Block: Popliteal block   Pre-Anesthetic Checklist: ,, timeout performed, Correct Patient, Correct Site, Correct Laterality, Correct Procedure, Correct Position, site marked, Risks and benefits discussed,  Surgical consent,  Pre-op evaluation,  At surgeon's request and post-op pain management  Laterality: Right  Prep: chloraprep       Needles:  Injection technique: Single-shot  Needle Type: Echogenic Stimulator Needle     Needle Length: 9cm  Needle Gauge: 21     Additional Needles:   Procedures:,,,, ultrasound used (permanent image in chart),,,,  Narrative:  Start time: 11/07/2020 12:58 PM End time: 11/07/2020 1:01 PM Injection made incrementally with aspirations every 5 mL.  Performed by: Personally  Anesthesiologist: Shelton Silvas, MD  Additional Notes: Patient tolerated the procedure well. Local anesthetic introduced in an incremental fashion under minimal resistance after negative aspirations. No paresthesias were elicited. After completion of the procedure, no acute issues were identified and patient continued to be monitored by RN.

## 2020-11-08 ENCOUNTER — Encounter: Payer: Medicaid Other | Admitting: Podiatry

## 2020-11-09 ENCOUNTER — Encounter (HOSPITAL_BASED_OUTPATIENT_CLINIC_OR_DEPARTMENT_OTHER): Payer: Self-pay | Admitting: Podiatry

## 2020-11-12 ENCOUNTER — Other Ambulatory Visit: Payer: Self-pay

## 2020-11-12 ENCOUNTER — Ambulatory Visit (INDEPENDENT_AMBULATORY_CARE_PROVIDER_SITE_OTHER): Payer: Medicaid Other

## 2020-11-12 ENCOUNTER — Ambulatory Visit (INDEPENDENT_AMBULATORY_CARE_PROVIDER_SITE_OTHER): Payer: Medicaid Other | Admitting: Podiatry

## 2020-11-12 DIAGNOSIS — S92311A Displaced fracture of first metatarsal bone, right foot, initial encounter for closed fracture: Secondary | ICD-10-CM

## 2020-11-12 DIAGNOSIS — S92301A Fracture of unspecified metatarsal bone(s), right foot, initial encounter for closed fracture: Secondary | ICD-10-CM | POA: Diagnosis not present

## 2020-11-12 DIAGNOSIS — S92514A Nondisplaced fracture of proximal phalanx of right lesser toe(s), initial encounter for closed fracture: Secondary | ICD-10-CM

## 2020-11-12 DIAGNOSIS — S92211A Displaced fracture of cuboid bone of right foot, initial encounter for closed fracture: Secondary | ICD-10-CM

## 2020-11-12 DIAGNOSIS — R600 Localized edema: Secondary | ICD-10-CM

## 2020-11-12 NOTE — Progress Notes (Signed)
  Subjective:  Patient ID: Nancy Kelley, female    DOB: 10-29-1990,  MRN: [id number]  Chief Complaint  Patient presents with  . Routine Post Op    POV #1 -pt dneies N/V/F/ch -pt states," doing good." -pt denies complaint - pain is bareable -no numbness/tingling Tx: percocet, BT, abx and boot - dressing intact   DOS: 11/07/20 Procedure: Right 1st met ORIF with arthrodesis   30 y.o. female presents with the above complaint. History confirmed with patient.   Objective:  Physical Exam: tenderness at the surgical site, local edema noted and calf supple, nontender. Incision: healing well, no significant drainage, no dehiscence, no significant erythema  No images are attached to the encounter.  Radiographs: X-ray of the right foot: consistent with post-op state. Suboptimal views due to patient positioning - patient could not comfortably move for better AP view.   Assessment:   1. Closed displaced fracture of first metatarsal bone of right foot, initial encounter   2. Closed displaced fracture of cuboid of right foot, initial encounter   3. Closed fracture of head of metatarsal bone of right foot, initial encounter   4. Closed nondisplaced fracture of proximal phalanx of lesser toe of right foot, initial encounter   5. Localized edema    Plan:  Patient was evaluated and treated and all questions answered.  Post-operative State -XR reviewed with patient -Dressing applied consisting of sterile gauze, kerlix and ACE bandage -NWB with knee scooter  -Continue Xarelto  No follow-ups on file.

## 2020-11-15 ENCOUNTER — Encounter: Payer: Medicaid Other | Admitting: Podiatry

## 2020-11-19 ENCOUNTER — Other Ambulatory Visit: Payer: Self-pay

## 2020-11-19 ENCOUNTER — Ambulatory Visit (INDEPENDENT_AMBULATORY_CARE_PROVIDER_SITE_OTHER): Payer: Medicaid Other | Admitting: Podiatry

## 2020-11-19 DIAGNOSIS — S92301D Fracture of unspecified metatarsal bone(s), right foot, subsequent encounter for fracture with routine healing: Secondary | ICD-10-CM

## 2020-11-19 DIAGNOSIS — Z9889 Other specified postprocedural states: Secondary | ICD-10-CM

## 2020-11-19 NOTE — Progress Notes (Signed)
  Subjective:  Patient ID: Nancy Kelley, female    DOB: Jan 23, 1991,  MRN: [id number]  Chief Complaint  Patient presents with  . Routine Post Op    POV#2 -pt denies N/V/F/Ch - dressing intact -pt states," doing alright." - no complaints Tx: xarelto, boot and elevation    DOS: 11/07/20 Procedure: Right 1st met ORIF with arthrodesis   30 y.o. female presents with the above complaint. History confirmed with patient.   Objective:  Physical Exam: tenderness at the surgical site, local edema noted and calf supple, nontender. Incision: healing well, no significant drainage, no dehiscence, no significant erythema  Assessment:   1. Closed fracture of head of metatarsal bone of right foot with routine healing, subsequent encounter   2. Post-operative state    Plan:  Patient was evaluated and treated and all questions answered.  Post-operative State -Sutures removed -Staples removed -Steri-strips applied to the incision  -Continue NWB with CAM boot -Continue Xarelto  No follow-ups on file.

## 2020-11-22 ENCOUNTER — Encounter: Payer: Medicaid Other | Admitting: Podiatry

## 2020-11-26 ENCOUNTER — Ambulatory Visit (INDEPENDENT_AMBULATORY_CARE_PROVIDER_SITE_OTHER): Payer: Medicaid Other | Admitting: Podiatry

## 2020-11-26 DIAGNOSIS — Z5329 Procedure and treatment not carried out because of patient's decision for other reasons: Secondary | ICD-10-CM

## 2020-11-26 NOTE — Progress Notes (Signed)
No show for post-op appointment. 

## 2020-11-29 ENCOUNTER — Ambulatory Visit (INDEPENDENT_AMBULATORY_CARE_PROVIDER_SITE_OTHER): Payer: Medicaid Other | Admitting: Podiatry

## 2020-11-29 DIAGNOSIS — Z5329 Procedure and treatment not carried out because of patient's decision for other reasons: Secondary | ICD-10-CM

## 2020-11-29 NOTE — Progress Notes (Signed)
No show for post-op appt. 2nd consecutive no show.

## 2020-12-04 ENCOUNTER — Other Ambulatory Visit: Payer: Self-pay | Admitting: Podiatry

## 2020-12-05 NOTE — Telephone Encounter (Signed)
Please advise 

## 2020-12-10 ENCOUNTER — Ambulatory Visit (INDEPENDENT_AMBULATORY_CARE_PROVIDER_SITE_OTHER): Payer: Medicaid Other | Admitting: Podiatry

## 2020-12-10 ENCOUNTER — Ambulatory Visit (INDEPENDENT_AMBULATORY_CARE_PROVIDER_SITE_OTHER): Payer: Medicaid Other

## 2020-12-10 ENCOUNTER — Other Ambulatory Visit: Payer: Self-pay

## 2020-12-10 DIAGNOSIS — Z9889 Other specified postprocedural states: Secondary | ICD-10-CM

## 2020-12-10 DIAGNOSIS — S92301D Fracture of unspecified metatarsal bone(s), right foot, subsequent encounter for fracture with routine healing: Secondary | ICD-10-CM

## 2020-12-10 DIAGNOSIS — R600 Localized edema: Secondary | ICD-10-CM

## 2020-12-10 NOTE — Progress Notes (Signed)
  Subjective:  Patient ID: Nancy Kelley, female    DOB: December 26, 1990,  MRN: [id number]  Chief Complaint  Patient presents with  . Routine Post Op    POV#3 -tp denies N/V/F/Ch -pt states," it's good." -pt denies pain and with less swelling tx: boot    DOS: 11/07/20 Procedure: Right 1st met ORIF with arthrodesis   30 y.o. female presents with the above complaint. History confirmed with patient.   Objective:  Physical Exam: no tenderness at the surgical site, local edema noted and calf supple, nontender. All MPJs rather tight, reducible on ROM exercises. Incision: fully healed.   Assessment:   1. Closed fracture of head of metatarsal bone of right foot with routine healing, subsequent encounter   2. Post-operative state   3. Localized edema    Plan:  Patient was evaluated and treated and all questions answered.  Post-operative State -Wound fully healed.  -Start WBAT with CAM boot -Discussed importance of ROM exercises of all the MPJs. She is tight here likely 2/2 prolonged NWB. Encouraged and educated on ROM exercises.  Return in about 3 weeks (around 12/31/2020).

## 2020-12-31 ENCOUNTER — Ambulatory Visit (INDEPENDENT_AMBULATORY_CARE_PROVIDER_SITE_OTHER): Payer: Medicaid Other | Admitting: Podiatry

## 2020-12-31 DIAGNOSIS — Z5329 Procedure and treatment not carried out because of patient's decision for other reasons: Secondary | ICD-10-CM

## 2020-12-31 NOTE — Progress Notes (Signed)
No show for appt. 

## 2021-01-03 ENCOUNTER — Ambulatory Visit (INDEPENDENT_AMBULATORY_CARE_PROVIDER_SITE_OTHER): Payer: Medicaid Other

## 2021-01-03 ENCOUNTER — Ambulatory Visit (INDEPENDENT_AMBULATORY_CARE_PROVIDER_SITE_OTHER): Payer: Medicaid Other | Admitting: Podiatry

## 2021-01-03 ENCOUNTER — Other Ambulatory Visit: Payer: Self-pay

## 2021-01-03 ENCOUNTER — Encounter: Payer: Self-pay | Admitting: Podiatry

## 2021-01-03 DIAGNOSIS — S92301D Fracture of unspecified metatarsal bone(s), right foot, subsequent encounter for fracture with routine healing: Secondary | ICD-10-CM
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
## 2021-02-14 ENCOUNTER — Other Ambulatory Visit: Payer: Self-pay

## 2021-02-14 ENCOUNTER — Ambulatory Visit: Payer: Medicaid Other | Admitting: Podiatry

## 2021-02-14 ENCOUNTER — Encounter: Payer: Self-pay | Admitting: Podiatry

## 2021-02-14 DIAGNOSIS — S92301D Fracture of unspecified metatarsal bone(s), right foot, subsequent encounter for fracture with routine healing: Secondary | ICD-10-CM | POA: Diagnosis not present

## 2021-02-14 DIAGNOSIS — M722 Plantar fascial fibromatosis: Secondary | ICD-10-CM

## 2021-02-14 MED ORDER — BETAMETHASONE SOD PHOS & ACET 6 (3-3) MG/ML IJ SUSP
6.0000 mg | Freq: Once | INTRAMUSCULAR | Status: AC
  Administered 2021-02-14: 6 mg

## 2021-02-14 NOTE — Progress Notes (Signed)
  Subjective:  Patient ID: Nancy Kelley, female    DOB: 1991-04-15,  MRN: [id number]  Chief Complaint  Patient presents with   Routine Post Op    I am doing ok and the right arch area has these knots and has been there for about 2 weeks    DOS: 11/07/20 Procedure: Right 1st met ORIF with arthrodesis   30 y.o. female presents with the above complaint. History confirmed with patient.   Objective:  Physical Exam: no tenderness at the surgical site, local edema noted and calf supple, nontender.  POP medial calc tuber, non-tender firm lesions medial arch x2 Incision: fully healed.   Assessment:   1. Plantar fasciitis   2. Plantar fascial fibromatosis   3. Closed fracture of head of metatarsal bone of right foot with routine healing, subsequent encounter     Plan:  Patient was evaluated and treated and all questions answered.  Post-operative State -Well healed, continue WBAT in normal shoegear  Plantar Fasciitis, right - XR reviewed as above.  - Educated on icing and stretching. Instructions given.  - Injection delivered to the plantar fascia as below. - Night splint dispensed.  Procedure: Injection Tendon/Ligament Location: Right plantar fascia at the glabrous junction; medial approach. Skin Prep: Alcohol. Injectate: 1 cc 0.5% marcaine plain, 1 cc dexamethasone phosphate, 0.5 cc kenalog 10. Disposition: Patient tolerated procedure well. Injection site dressed with a band-aid.   Return if symptoms worsen or fail to improve.
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
